# Patient Record
Sex: Female | Born: 1955 | Race: White | Hispanic: Yes | Marital: Married | Smoking: Current every day smoker
Health system: Southern US, Community
[De-identification: ages and names within clinical notes are randomized; demographics above are authoritative.]

## PROBLEM LIST (undated history)

## (undated) DIAGNOSIS — E7800 Pure hypercholesterolemia, unspecified: Secondary | ICD-10-CM

## (undated) DIAGNOSIS — F909 Attention-deficit hyperactivity disorder, unspecified type: Secondary | ICD-10-CM

---

## 2002-02-10 ENCOUNTER — Inpatient Hospital Stay (HOSPITAL_COMMUNITY): Admission: EM | Admit: 2002-02-10 | Discharge: 2002-02-14 | Payer: Self-pay | Admitting: General Surgery

## 2002-02-10 ENCOUNTER — Encounter: Payer: Self-pay | Admitting: Obstetrics and Gynecology

## 2003-10-08 ENCOUNTER — Emergency Department (HOSPITAL_COMMUNITY): Admission: EM | Admit: 2003-10-08 | Discharge: 2003-10-09 | Payer: Self-pay | Admitting: Emergency Medicine

## 2004-01-01 ENCOUNTER — Ambulatory Visit: Payer: Self-pay | Admitting: Oncology

## 2004-01-02 ENCOUNTER — Inpatient Hospital Stay (HOSPITAL_COMMUNITY): Admission: RE | Admit: 2004-01-02 | Discharge: 2004-01-03 | Payer: Self-pay | Admitting: Surgery

## 2004-01-06 ENCOUNTER — Ambulatory Visit: Payer: Self-pay | Admitting: Oncology

## 2004-02-05 ENCOUNTER — Ambulatory Visit (HOSPITAL_COMMUNITY): Admission: RE | Admit: 2004-02-05 | Discharge: 2004-02-05 | Payer: Self-pay | Admitting: Oncology

## 2004-05-05 ENCOUNTER — Ambulatory Visit: Payer: Self-pay | Admitting: Oncology

## 2005-11-03 ENCOUNTER — Ambulatory Visit (HOSPITAL_COMMUNITY): Admission: RE | Admit: 2005-11-03 | Discharge: 2005-11-03 | Payer: Self-pay | Admitting: Obstetrics and Gynecology

## 2005-11-16 ENCOUNTER — Encounter: Admission: RE | Admit: 2005-11-16 | Discharge: 2005-11-16 | Payer: Self-pay

## 2013-08-08 ENCOUNTER — Other Ambulatory Visit: Payer: Self-pay | Admitting: Gastroenterology

## 2014-12-16 ENCOUNTER — Emergency Department (HOSPITAL_COMMUNITY): Payer: BLUE CROSS/BLUE SHIELD

## 2014-12-16 ENCOUNTER — Encounter (HOSPITAL_COMMUNITY): Payer: Self-pay | Admitting: Family Medicine

## 2014-12-16 ENCOUNTER — Emergency Department (HOSPITAL_COMMUNITY)
Admission: EM | Admit: 2014-12-16 | Discharge: 2014-12-16 | Disposition: A | Discharge status: Home / Self Care | Payer: BLUE CROSS/BLUE SHIELD | Attending: Emergency Medicine | Admitting: Emergency Medicine

## 2014-12-16 DIAGNOSIS — Y9389 Activity, other specified: Secondary | ICD-10-CM | POA: Diagnosis not present

## 2014-12-16 DIAGNOSIS — S99911A Unspecified injury of right ankle, initial encounter: Secondary | ICD-10-CM | POA: Diagnosis not present

## 2014-12-16 DIAGNOSIS — S29001A Unspecified injury of muscle and tendon of front wall of thorax, initial encounter: Secondary | ICD-10-CM | POA: Insufficient documentation

## 2014-12-16 DIAGNOSIS — F17200 Nicotine dependence, unspecified, uncomplicated: Secondary | ICD-10-CM | POA: Insufficient documentation

## 2014-12-16 DIAGNOSIS — Z8639 Personal history of other endocrine, nutritional and metabolic disease: Secondary | ICD-10-CM | POA: Diagnosis not present

## 2014-12-16 DIAGNOSIS — Z8659 Personal history of other mental and behavioral disorders: Secondary | ICD-10-CM | POA: Insufficient documentation

## 2014-12-16 DIAGNOSIS — Y998 Other external cause status: Secondary | ICD-10-CM | POA: Insufficient documentation

## 2014-12-16 DIAGNOSIS — S199XXA Unspecified injury of neck, initial encounter: Secondary | ICD-10-CM | POA: Diagnosis not present

## 2014-12-16 DIAGNOSIS — Y92410 Unspecified street and highway as the place of occurrence of the external cause: Secondary | ICD-10-CM | POA: Diagnosis not present

## 2014-12-16 DIAGNOSIS — R0789 Other chest pain: Secondary | ICD-10-CM

## 2014-12-16 DIAGNOSIS — M25571 Pain in right ankle and joints of right foot: Secondary | ICD-10-CM

## 2014-12-16 HISTORY — DX: Attention-deficit hyperactivity disorder, unspecified type: F90.9

## 2014-12-16 HISTORY — DX: Pure hypercholesterolemia, unspecified: E78.00

## 2014-12-16 MED ORDER — HYDROCODONE-ACETAMINOPHEN 5-325 MG PO TABS
1.0000 | ORAL_TABLET | Freq: Once | ORAL | Status: AC
Start: 2014-12-16 — End: 2014-12-16
  Administered 2014-12-16: 1 via ORAL
  Filled 2014-12-16: qty 1

## 2014-12-16 MED ORDER — IBUPROFEN 600 MG PO TABS
600.0000 mg | ORAL_TABLET | Freq: Four times a day (QID) | ORAL | Status: AC | PRN
Start: 2014-12-16 — End: ?

## 2014-12-16 MED ORDER — HYDROCODONE-ACETAMINOPHEN 5-325 MG PO TABS: 1.0000 | ORAL_TABLET | ORAL | Status: AC | PRN
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
## 2015-03-05 ENCOUNTER — Other Ambulatory Visit (HOSPITAL_COMMUNITY)
Admission: RE | Admit: 2015-03-05 | Discharge: 2015-03-05 | Disposition: A | Discharge status: Home / Self Care | Payer: BLUE CROSS/BLUE SHIELD | Source: Ambulatory Visit | Attending: Family Medicine | Admitting: Family Medicine

## 2015-03-05 ENCOUNTER — Other Ambulatory Visit: Payer: Self-pay | Admitting: Family Medicine

## 2015-03-05 DIAGNOSIS — Z1151 Encounter for screening for human papillomavirus (HPV): Secondary | ICD-10-CM | POA: Diagnosis present

## 2015-03-05 DIAGNOSIS — Z01419 Encounter for gynecological examination (general) (routine) without abnormal findings: Secondary | ICD-10-CM | POA: Diagnosis present

## 2015-03-07 LAB — CYTOLOGY - PAP

## 2016-03-16 DIAGNOSIS — E890 Postprocedural hypothyroidism: Secondary | ICD-10-CM | POA: Diagnosis not present

## 2016-03-16 DIAGNOSIS — R799 Abnormal finding of blood chemistry, unspecified: Secondary | ICD-10-CM | POA: Diagnosis not present

## 2016-03-16 DIAGNOSIS — M25649 Stiffness of unspecified hand, not elsewhere classified: Secondary | ICD-10-CM | POA: Diagnosis not present

## 2016-03-16 DIAGNOSIS — E559 Vitamin D deficiency, unspecified: Secondary | ICD-10-CM | POA: Diagnosis not present

## 2016-03-16 DIAGNOSIS — E7800 Pure hypercholesterolemia, unspecified: Secondary | ICD-10-CM | POA: Diagnosis not present

## 2016-03-16 DIAGNOSIS — Z0000 Encounter for general adult medical examination without abnormal findings: Secondary | ICD-10-CM | POA: Diagnosis not present

## 2016-03-24 DIAGNOSIS — F900 Attention-deficit hyperactivity disorder, predominantly inattentive type: Secondary | ICD-10-CM | POA: Diagnosis not present

## 2016-03-24 DIAGNOSIS — F419 Anxiety disorder, unspecified: Secondary | ICD-10-CM | POA: Diagnosis not present

## 2016-03-31 DIAGNOSIS — R748 Abnormal levels of other serum enzymes: Secondary | ICD-10-CM | POA: Diagnosis not present

## 2017-03-24 DIAGNOSIS — E7800 Pure hypercholesterolemia, unspecified: Secondary | ICD-10-CM | POA: Diagnosis not present

## 2017-03-24 DIAGNOSIS — E559 Vitamin D deficiency, unspecified: Secondary | ICD-10-CM | POA: Diagnosis not present

## 2017-03-24 DIAGNOSIS — K219 Gastro-esophageal reflux disease without esophagitis: Secondary | ICD-10-CM | POA: Diagnosis not present

## 2017-03-24 DIAGNOSIS — E890 Postprocedural hypothyroidism: Secondary | ICD-10-CM | POA: Diagnosis not present

## 2017-03-24 DIAGNOSIS — Z0000 Encounter for general adult medical examination without abnormal findings: Secondary | ICD-10-CM | POA: Diagnosis not present

## 2017-03-31 ENCOUNTER — Other Ambulatory Visit: Payer: Self-pay | Admitting: Family Medicine

## 2017-03-31 DIAGNOSIS — Z780 Asymptomatic menopausal state: Secondary | ICD-10-CM

## 2017-05-11 ENCOUNTER — Other Ambulatory Visit: Payer: Self-pay | Admitting: Gastroenterology

## 2017-05-11 DIAGNOSIS — R748 Abnormal levels of other serum enzymes: Secondary | ICD-10-CM | POA: Diagnosis not present

## 2017-05-31 DIAGNOSIS — F419 Anxiety disorder, unspecified: Secondary | ICD-10-CM | POA: Diagnosis not present

## 2017-05-31 DIAGNOSIS — F900 Attention-deficit hyperactivity disorder, predominantly inattentive type: Secondary | ICD-10-CM | POA: Diagnosis not present

## 2017-06-20 ENCOUNTER — Other Ambulatory Visit: Payer: BLUE CROSS/BLUE SHIELD

## 2017-07-25 DIAGNOSIS — Z23 Encounter for immunization: Secondary | ICD-10-CM | POA: Diagnosis not present

## 2017-07-27 ENCOUNTER — Ambulatory Visit
Admission: RE | Admit: 2017-07-27 | Discharge: 2017-07-27 | Disposition: A | Discharge status: Home / Self Care | Payer: BLUE CROSS/BLUE SHIELD | Source: Ambulatory Visit | Attending: Gastroenterology | Admitting: Gastroenterology

## 2017-07-27 DIAGNOSIS — R748 Abnormal levels of other serum enzymes: Secondary | ICD-10-CM

## 2017-09-27 DIAGNOSIS — R748 Abnormal levels of other serum enzymes: Secondary | ICD-10-CM | POA: Diagnosis not present

## 2017-12-01 DIAGNOSIS — F419 Anxiety disorder, unspecified: Secondary | ICD-10-CM | POA: Diagnosis not present

## 2017-12-01 DIAGNOSIS — F902 Attention-deficit hyperactivity disorder, combined type: Secondary | ICD-10-CM | POA: Diagnosis not present

## 2018-04-04 DIAGNOSIS — E7800 Pure hypercholesterolemia, unspecified: Secondary | ICD-10-CM | POA: Diagnosis not present

## 2018-04-04 DIAGNOSIS — Z0000 Encounter for general adult medical examination without abnormal findings: Secondary | ICD-10-CM | POA: Diagnosis not present

## 2018-04-04 DIAGNOSIS — K219 Gastro-esophageal reflux disease without esophagitis: Secondary | ICD-10-CM | POA: Diagnosis not present

## 2018-04-04 DIAGNOSIS — E559 Vitamin D deficiency, unspecified: Secondary | ICD-10-CM | POA: Diagnosis not present

## 2018-04-04 DIAGNOSIS — E890 Postprocedural hypothyroidism: Secondary | ICD-10-CM | POA: Diagnosis not present

## 2018-07-19 DIAGNOSIS — F419 Anxiety disorder, unspecified: Secondary | ICD-10-CM | POA: Diagnosis not present

## 2018-07-19 DIAGNOSIS — F902 Attention-deficit hyperactivity disorder, combined type: Secondary | ICD-10-CM | POA: Diagnosis not present

## 2018-11-02 DIAGNOSIS — R748 Abnormal levels of other serum enzymes: Secondary | ICD-10-CM | POA: Diagnosis not present

## 2019-02-14 DIAGNOSIS — F419 Anxiety disorder, unspecified: Secondary | ICD-10-CM | POA: Diagnosis not present

## 2019-02-14 DIAGNOSIS — F902 Attention-deficit hyperactivity disorder, combined type: Secondary | ICD-10-CM | POA: Diagnosis not present

## 2019-04-18 DIAGNOSIS — Z0000 Encounter for general adult medical examination without abnormal findings: Secondary | ICD-10-CM | POA: Diagnosis not present

## 2019-04-18 DIAGNOSIS — E7800 Pure hypercholesterolemia, unspecified: Secondary | ICD-10-CM | POA: Diagnosis not present

## 2019-04-18 DIAGNOSIS — E890 Postprocedural hypothyroidism: Secondary | ICD-10-CM | POA: Diagnosis not present

## 2019-04-18 DIAGNOSIS — R748 Abnormal levels of other serum enzymes: Secondary | ICD-10-CM | POA: Diagnosis not present

## 2019-04-18 DIAGNOSIS — Z131 Encounter for screening for diabetes mellitus: Secondary | ICD-10-CM | POA: Diagnosis not present

## 2019-04-20 ENCOUNTER — Other Ambulatory Visit: Payer: Self-pay | Admitting: Family Medicine

## 2019-04-20 DIAGNOSIS — Z1231 Encounter for screening mammogram for malignant neoplasm of breast: Secondary | ICD-10-CM

## 2019-05-23 DIAGNOSIS — Z03818 Encounter for observation for suspected exposure to other biological agents ruled out: Secondary | ICD-10-CM | POA: Diagnosis not present

## 2019-06-03 DIAGNOSIS — H109 Unspecified conjunctivitis: Secondary | ICD-10-CM | POA: Diagnosis not present

## 2019-06-12 DIAGNOSIS — H1033 Unspecified acute conjunctivitis, bilateral: Secondary | ICD-10-CM | POA: Diagnosis not present

## 2019-06-12 DIAGNOSIS — H18893 Other specified disorders of cornea, bilateral: Secondary | ICD-10-CM | POA: Diagnosis not present

## 2019-06-20 ENCOUNTER — Other Ambulatory Visit: Payer: Self-pay

## 2019-06-20 ENCOUNTER — Ambulatory Visit
Admission: RE | Admit: 2019-06-20 | Discharge: 2019-06-20 | Disposition: A | Discharge status: Home / Self Care | Payer: BC Managed Care – PPO | Source: Ambulatory Visit | Attending: Family Medicine | Admitting: Family Medicine

## 2019-06-20 DIAGNOSIS — Z1231 Encounter for screening mammogram for malignant neoplasm of breast: Secondary | ICD-10-CM

## 2019-08-15 DIAGNOSIS — F411 Generalized anxiety disorder: Secondary | ICD-10-CM | POA: Diagnosis not present

## 2019-08-15 DIAGNOSIS — F902 Attention-deficit hyperactivity disorder, combined type: Secondary | ICD-10-CM | POA: Diagnosis not present

## 2019-09-11 DIAGNOSIS — Z20822 Contact with and (suspected) exposure to covid-19: Secondary | ICD-10-CM | POA: Diagnosis not present

## 2019-11-06 DIAGNOSIS — Z20822 Contact with and (suspected) exposure to covid-19: Secondary | ICD-10-CM | POA: Diagnosis not present

## 2019-11-14 DIAGNOSIS — F902 Attention-deficit hyperactivity disorder, combined type: Secondary | ICD-10-CM | POA: Diagnosis not present

## 2019-11-14 DIAGNOSIS — F411 Generalized anxiety disorder: Secondary | ICD-10-CM | POA: Diagnosis not present

## 2019-12-03 DIAGNOSIS — Z20822 Contact with and (suspected) exposure to covid-19: Secondary | ICD-10-CM | POA: Diagnosis not present

## 2019-12-13 DIAGNOSIS — Z20822 Contact with and (suspected) exposure to covid-19: Secondary | ICD-10-CM | POA: Diagnosis not present

## 2020-02-13 DIAGNOSIS — F900 Attention-deficit hyperactivity disorder, predominantly inattentive type: Secondary | ICD-10-CM | POA: Diagnosis not present

## 2020-02-13 DIAGNOSIS — F411 Generalized anxiety disorder: Secondary | ICD-10-CM | POA: Diagnosis not present

## 2020-02-19 DIAGNOSIS — Z20822 Contact with and (suspected) exposure to covid-19: Secondary | ICD-10-CM | POA: Diagnosis not present

## 2020-06-11 DIAGNOSIS — F411 Generalized anxiety disorder: Secondary | ICD-10-CM | POA: Diagnosis not present

## 2020-06-11 DIAGNOSIS — F900 Attention-deficit hyperactivity disorder, predominantly inattentive type: Secondary | ICD-10-CM | POA: Diagnosis not present

## 2020-06-17 DIAGNOSIS — M9905 Segmental and somatic dysfunction of pelvic region: Secondary | ICD-10-CM | POA: Diagnosis not present

## 2020-06-17 DIAGNOSIS — M9903 Segmental and somatic dysfunction of lumbar region: Secondary | ICD-10-CM | POA: Diagnosis not present

## 2020-06-17 DIAGNOSIS — M9901 Segmental and somatic dysfunction of cervical region: Secondary | ICD-10-CM | POA: Diagnosis not present

## 2020-06-17 DIAGNOSIS — M9904 Segmental and somatic dysfunction of sacral region: Secondary | ICD-10-CM | POA: Diagnosis not present

## 2020-06-19 DIAGNOSIS — M9904 Segmental and somatic dysfunction of sacral region: Secondary | ICD-10-CM | POA: Diagnosis not present

## 2020-06-19 DIAGNOSIS — M9901 Segmental and somatic dysfunction of cervical region: Secondary | ICD-10-CM | POA: Diagnosis not present

## 2020-06-19 DIAGNOSIS — M9905 Segmental and somatic dysfunction of pelvic region: Secondary | ICD-10-CM | POA: Diagnosis not present

## 2020-06-19 DIAGNOSIS — M9903 Segmental and somatic dysfunction of lumbar region: Secondary | ICD-10-CM | POA: Diagnosis not present

## 2020-06-20 DIAGNOSIS — M9903 Segmental and somatic dysfunction of lumbar region: Secondary | ICD-10-CM | POA: Diagnosis not present

## 2020-06-20 DIAGNOSIS — M9905 Segmental and somatic dysfunction of pelvic region: Secondary | ICD-10-CM | POA: Diagnosis not present

## 2020-06-20 DIAGNOSIS — M9904 Segmental and somatic dysfunction of sacral region: Secondary | ICD-10-CM | POA: Diagnosis not present

## 2020-06-20 DIAGNOSIS — M9901 Segmental and somatic dysfunction of cervical region: Secondary | ICD-10-CM | POA: Diagnosis not present

## 2020-06-23 DIAGNOSIS — M9905 Segmental and somatic dysfunction of pelvic region: Secondary | ICD-10-CM | POA: Diagnosis not present

## 2020-06-23 DIAGNOSIS — M9904 Segmental and somatic dysfunction of sacral region: Secondary | ICD-10-CM | POA: Diagnosis not present

## 2020-06-23 DIAGNOSIS — M9901 Segmental and somatic dysfunction of cervical region: Secondary | ICD-10-CM | POA: Diagnosis not present

## 2020-06-23 DIAGNOSIS — M9903 Segmental and somatic dysfunction of lumbar region: Secondary | ICD-10-CM | POA: Diagnosis not present

## 2020-06-24 DIAGNOSIS — M9904 Segmental and somatic dysfunction of sacral region: Secondary | ICD-10-CM | POA: Diagnosis not present

## 2020-06-24 DIAGNOSIS — M9901 Segmental and somatic dysfunction of cervical region: Secondary | ICD-10-CM | POA: Diagnosis not present

## 2020-06-24 DIAGNOSIS — M9905 Segmental and somatic dysfunction of pelvic region: Secondary | ICD-10-CM | POA: Diagnosis not present

## 2020-06-24 DIAGNOSIS — M9903 Segmental and somatic dysfunction of lumbar region: Secondary | ICD-10-CM | POA: Diagnosis not present

## 2020-06-26 DIAGNOSIS — M9903 Segmental and somatic dysfunction of lumbar region: Secondary | ICD-10-CM | POA: Diagnosis not present

## 2020-06-26 DIAGNOSIS — M9905 Segmental and somatic dysfunction of pelvic region: Secondary | ICD-10-CM | POA: Diagnosis not present

## 2020-06-26 DIAGNOSIS — M9901 Segmental and somatic dysfunction of cervical region: Secondary | ICD-10-CM | POA: Diagnosis not present

## 2020-06-26 DIAGNOSIS — M9904 Segmental and somatic dysfunction of sacral region: Secondary | ICD-10-CM | POA: Diagnosis not present

## 2020-07-01 DIAGNOSIS — M9903 Segmental and somatic dysfunction of lumbar region: Secondary | ICD-10-CM | POA: Diagnosis not present

## 2020-07-01 DIAGNOSIS — M9901 Segmental and somatic dysfunction of cervical region: Secondary | ICD-10-CM | POA: Diagnosis not present

## 2020-07-01 DIAGNOSIS — M9904 Segmental and somatic dysfunction of sacral region: Secondary | ICD-10-CM | POA: Diagnosis not present

## 2020-07-01 DIAGNOSIS — M9905 Segmental and somatic dysfunction of pelvic region: Secondary | ICD-10-CM | POA: Diagnosis not present

## 2020-07-11 DIAGNOSIS — M9905 Segmental and somatic dysfunction of pelvic region: Secondary | ICD-10-CM | POA: Diagnosis not present

## 2020-07-11 DIAGNOSIS — M9901 Segmental and somatic dysfunction of cervical region: Secondary | ICD-10-CM | POA: Diagnosis not present

## 2020-07-11 DIAGNOSIS — M9903 Segmental and somatic dysfunction of lumbar region: Secondary | ICD-10-CM | POA: Diagnosis not present

## 2020-07-11 DIAGNOSIS — M9904 Segmental and somatic dysfunction of sacral region: Secondary | ICD-10-CM | POA: Diagnosis not present

## 2020-07-14 DIAGNOSIS — M9903 Segmental and somatic dysfunction of lumbar region: Secondary | ICD-10-CM | POA: Diagnosis not present

## 2020-07-14 DIAGNOSIS — M9905 Segmental and somatic dysfunction of pelvic region: Secondary | ICD-10-CM | POA: Diagnosis not present

## 2020-07-14 DIAGNOSIS — M9901 Segmental and somatic dysfunction of cervical region: Secondary | ICD-10-CM | POA: Diagnosis not present

## 2020-07-14 DIAGNOSIS — M9904 Segmental and somatic dysfunction of sacral region: Secondary | ICD-10-CM | POA: Diagnosis not present

## 2020-07-15 DIAGNOSIS — M9903 Segmental and somatic dysfunction of lumbar region: Secondary | ICD-10-CM | POA: Diagnosis not present

## 2020-07-15 DIAGNOSIS — M9905 Segmental and somatic dysfunction of pelvic region: Secondary | ICD-10-CM | POA: Diagnosis not present

## 2020-07-15 DIAGNOSIS — M9904 Segmental and somatic dysfunction of sacral region: Secondary | ICD-10-CM | POA: Diagnosis not present

## 2020-07-15 DIAGNOSIS — M9901 Segmental and somatic dysfunction of cervical region: Secondary | ICD-10-CM | POA: Diagnosis not present

## 2020-07-17 DIAGNOSIS — M9903 Segmental and somatic dysfunction of lumbar region: Secondary | ICD-10-CM | POA: Diagnosis not present

## 2020-07-17 DIAGNOSIS — M9904 Segmental and somatic dysfunction of sacral region: Secondary | ICD-10-CM | POA: Diagnosis not present

## 2020-07-17 DIAGNOSIS — M9905 Segmental and somatic dysfunction of pelvic region: Secondary | ICD-10-CM | POA: Diagnosis not present

## 2020-07-17 DIAGNOSIS — M9901 Segmental and somatic dysfunction of cervical region: Secondary | ICD-10-CM | POA: Diagnosis not present

## 2020-07-21 DIAGNOSIS — M9903 Segmental and somatic dysfunction of lumbar region: Secondary | ICD-10-CM | POA: Diagnosis not present

## 2020-07-21 DIAGNOSIS — M9901 Segmental and somatic dysfunction of cervical region: Secondary | ICD-10-CM | POA: Diagnosis not present

## 2020-07-21 DIAGNOSIS — M9905 Segmental and somatic dysfunction of pelvic region: Secondary | ICD-10-CM | POA: Diagnosis not present

## 2020-07-21 DIAGNOSIS — M9904 Segmental and somatic dysfunction of sacral region: Secondary | ICD-10-CM | POA: Diagnosis not present

## 2020-07-22 DIAGNOSIS — M9904 Segmental and somatic dysfunction of sacral region: Secondary | ICD-10-CM | POA: Diagnosis not present

## 2020-07-22 DIAGNOSIS — M9905 Segmental and somatic dysfunction of pelvic region: Secondary | ICD-10-CM | POA: Diagnosis not present

## 2020-07-22 DIAGNOSIS — M9903 Segmental and somatic dysfunction of lumbar region: Secondary | ICD-10-CM | POA: Diagnosis not present

## 2020-07-22 DIAGNOSIS — M9901 Segmental and somatic dysfunction of cervical region: Secondary | ICD-10-CM | POA: Diagnosis not present

## 2020-07-23 DIAGNOSIS — L942 Calcinosis cutis: Secondary | ICD-10-CM | POA: Diagnosis not present

## 2020-07-23 DIAGNOSIS — D485 Neoplasm of uncertain behavior of skin: Secondary | ICD-10-CM | POA: Diagnosis not present

## 2020-07-23 DIAGNOSIS — L570 Actinic keratosis: Secondary | ICD-10-CM | POA: Diagnosis not present

## 2020-07-24 DIAGNOSIS — M9904 Segmental and somatic dysfunction of sacral region: Secondary | ICD-10-CM | POA: Diagnosis not present

## 2020-07-24 DIAGNOSIS — M9903 Segmental and somatic dysfunction of lumbar region: Secondary | ICD-10-CM | POA: Diagnosis not present

## 2020-07-24 DIAGNOSIS — M9901 Segmental and somatic dysfunction of cervical region: Secondary | ICD-10-CM | POA: Diagnosis not present

## 2020-07-24 DIAGNOSIS — M9905 Segmental and somatic dysfunction of pelvic region: Secondary | ICD-10-CM | POA: Diagnosis not present

## 2020-07-28 DIAGNOSIS — M9905 Segmental and somatic dysfunction of pelvic region: Secondary | ICD-10-CM | POA: Diagnosis not present

## 2020-07-28 DIAGNOSIS — M9903 Segmental and somatic dysfunction of lumbar region: Secondary | ICD-10-CM | POA: Diagnosis not present

## 2020-07-28 DIAGNOSIS — M9904 Segmental and somatic dysfunction of sacral region: Secondary | ICD-10-CM | POA: Diagnosis not present

## 2020-07-28 DIAGNOSIS — M9901 Segmental and somatic dysfunction of cervical region: Secondary | ICD-10-CM | POA: Diagnosis not present

## 2020-08-01 DIAGNOSIS — M9905 Segmental and somatic dysfunction of pelvic region: Secondary | ICD-10-CM | POA: Diagnosis not present

## 2020-08-01 DIAGNOSIS — M9903 Segmental and somatic dysfunction of lumbar region: Secondary | ICD-10-CM | POA: Diagnosis not present

## 2020-08-01 DIAGNOSIS — M9904 Segmental and somatic dysfunction of sacral region: Secondary | ICD-10-CM | POA: Diagnosis not present

## 2020-08-01 DIAGNOSIS — M9901 Segmental and somatic dysfunction of cervical region: Secondary | ICD-10-CM | POA: Diagnosis not present

## 2020-08-05 ENCOUNTER — Other Ambulatory Visit: Payer: Self-pay | Admitting: Family Medicine

## 2020-08-05 DIAGNOSIS — M9903 Segmental and somatic dysfunction of lumbar region: Secondary | ICD-10-CM | POA: Diagnosis not present

## 2020-08-05 DIAGNOSIS — M9904 Segmental and somatic dysfunction of sacral region: Secondary | ICD-10-CM | POA: Diagnosis not present

## 2020-08-05 DIAGNOSIS — M9901 Segmental and somatic dysfunction of cervical region: Secondary | ICD-10-CM | POA: Diagnosis not present

## 2020-08-05 DIAGNOSIS — Z1231 Encounter for screening mammogram for malignant neoplasm of breast: Secondary | ICD-10-CM

## 2020-08-05 DIAGNOSIS — M9905 Segmental and somatic dysfunction of pelvic region: Secondary | ICD-10-CM | POA: Diagnosis not present

## 2020-08-07 ENCOUNTER — Ambulatory Visit
Admission: RE | Admit: 2020-08-07 | Discharge: 2020-08-07 | Disposition: A | Discharge status: Home / Self Care | Payer: BC Managed Care – PPO | Source: Ambulatory Visit

## 2020-08-07 ENCOUNTER — Other Ambulatory Visit: Payer: Self-pay

## 2020-08-07 DIAGNOSIS — E559 Vitamin D deficiency, unspecified: Secondary | ICD-10-CM | POA: Diagnosis not present

## 2020-08-07 DIAGNOSIS — Z124 Encounter for screening for malignant neoplasm of cervix: Secondary | ICD-10-CM | POA: Diagnosis not present

## 2020-08-07 DIAGNOSIS — R748 Abnormal levels of other serum enzymes: Secondary | ICD-10-CM | POA: Diagnosis not present

## 2020-08-07 DIAGNOSIS — M9901 Segmental and somatic dysfunction of cervical region: Secondary | ICD-10-CM | POA: Diagnosis not present

## 2020-08-07 DIAGNOSIS — M9904 Segmental and somatic dysfunction of sacral region: Secondary | ICD-10-CM | POA: Diagnosis not present

## 2020-08-07 DIAGNOSIS — K219 Gastro-esophageal reflux disease without esophagitis: Secondary | ICD-10-CM | POA: Diagnosis not present

## 2020-08-07 DIAGNOSIS — Z1231 Encounter for screening mammogram for malignant neoplasm of breast: Secondary | ICD-10-CM | POA: Diagnosis not present

## 2020-08-07 DIAGNOSIS — M9903 Segmental and somatic dysfunction of lumbar region: Secondary | ICD-10-CM | POA: Diagnosis not present

## 2020-08-07 DIAGNOSIS — Z0000 Encounter for general adult medical examination without abnormal findings: Secondary | ICD-10-CM | POA: Diagnosis not present

## 2020-08-07 DIAGNOSIS — E7800 Pure hypercholesterolemia, unspecified: Secondary | ICD-10-CM | POA: Diagnosis not present

## 2020-08-07 DIAGNOSIS — M9905 Segmental and somatic dysfunction of pelvic region: Secondary | ICD-10-CM | POA: Diagnosis not present

## 2020-08-12 DIAGNOSIS — M9905 Segmental and somatic dysfunction of pelvic region: Secondary | ICD-10-CM | POA: Diagnosis not present

## 2020-08-12 DIAGNOSIS — M9904 Segmental and somatic dysfunction of sacral region: Secondary | ICD-10-CM | POA: Diagnosis not present

## 2020-08-12 DIAGNOSIS — M9903 Segmental and somatic dysfunction of lumbar region: Secondary | ICD-10-CM | POA: Diagnosis not present

## 2020-08-12 DIAGNOSIS — M9901 Segmental and somatic dysfunction of cervical region: Secondary | ICD-10-CM | POA: Diagnosis not present

## 2020-08-14 DIAGNOSIS — M9904 Segmental and somatic dysfunction of sacral region: Secondary | ICD-10-CM | POA: Diagnosis not present

## 2020-08-14 DIAGNOSIS — M9905 Segmental and somatic dysfunction of pelvic region: Secondary | ICD-10-CM | POA: Diagnosis not present

## 2020-08-14 DIAGNOSIS — M9901 Segmental and somatic dysfunction of cervical region: Secondary | ICD-10-CM | POA: Diagnosis not present

## 2020-08-14 DIAGNOSIS — M9903 Segmental and somatic dysfunction of lumbar region: Secondary | ICD-10-CM | POA: Diagnosis not present

## 2020-08-19 DIAGNOSIS — M9903 Segmental and somatic dysfunction of lumbar region: Secondary | ICD-10-CM | POA: Diagnosis not present

## 2020-08-19 DIAGNOSIS — M9904 Segmental and somatic dysfunction of sacral region: Secondary | ICD-10-CM | POA: Diagnosis not present

## 2020-08-19 DIAGNOSIS — M9901 Segmental and somatic dysfunction of cervical region: Secondary | ICD-10-CM | POA: Diagnosis not present

## 2020-08-19 DIAGNOSIS — M9905 Segmental and somatic dysfunction of pelvic region: Secondary | ICD-10-CM | POA: Diagnosis not present

## 2020-08-21 DIAGNOSIS — M9904 Segmental and somatic dysfunction of sacral region: Secondary | ICD-10-CM | POA: Diagnosis not present

## 2020-08-21 DIAGNOSIS — M9905 Segmental and somatic dysfunction of pelvic region: Secondary | ICD-10-CM | POA: Diagnosis not present

## 2020-08-21 DIAGNOSIS — M9901 Segmental and somatic dysfunction of cervical region: Secondary | ICD-10-CM | POA: Diagnosis not present

## 2020-08-21 DIAGNOSIS — M9903 Segmental and somatic dysfunction of lumbar region: Secondary | ICD-10-CM | POA: Diagnosis not present

## 2020-08-26 DIAGNOSIS — M9905 Segmental and somatic dysfunction of pelvic region: Secondary | ICD-10-CM | POA: Diagnosis not present

## 2020-08-26 DIAGNOSIS — M9901 Segmental and somatic dysfunction of cervical region: Secondary | ICD-10-CM | POA: Diagnosis not present

## 2020-08-26 DIAGNOSIS — M9904 Segmental and somatic dysfunction of sacral region: Secondary | ICD-10-CM | POA: Diagnosis not present

## 2020-08-26 DIAGNOSIS — M9903 Segmental and somatic dysfunction of lumbar region: Secondary | ICD-10-CM | POA: Diagnosis not present

## 2020-08-27 DIAGNOSIS — M9904 Segmental and somatic dysfunction of sacral region: Secondary | ICD-10-CM | POA: Diagnosis not present

## 2020-08-27 DIAGNOSIS — M9905 Segmental and somatic dysfunction of pelvic region: Secondary | ICD-10-CM | POA: Diagnosis not present

## 2020-08-27 DIAGNOSIS — M9901 Segmental and somatic dysfunction of cervical region: Secondary | ICD-10-CM | POA: Diagnosis not present

## 2020-08-27 DIAGNOSIS — M9903 Segmental and somatic dysfunction of lumbar region: Secondary | ICD-10-CM | POA: Diagnosis not present

## 2020-08-28 DIAGNOSIS — M9904 Segmental and somatic dysfunction of sacral region: Secondary | ICD-10-CM | POA: Diagnosis not present

## 2020-08-28 DIAGNOSIS — M9901 Segmental and somatic dysfunction of cervical region: Secondary | ICD-10-CM | POA: Diagnosis not present

## 2020-08-28 DIAGNOSIS — M9903 Segmental and somatic dysfunction of lumbar region: Secondary | ICD-10-CM | POA: Diagnosis not present

## 2020-08-28 DIAGNOSIS — M9905 Segmental and somatic dysfunction of pelvic region: Secondary | ICD-10-CM | POA: Diagnosis not present

## 2020-09-01 DIAGNOSIS — M9901 Segmental and somatic dysfunction of cervical region: Secondary | ICD-10-CM | POA: Diagnosis not present

## 2020-09-01 DIAGNOSIS — M9904 Segmental and somatic dysfunction of sacral region: Secondary | ICD-10-CM | POA: Diagnosis not present

## 2020-09-01 DIAGNOSIS — M9905 Segmental and somatic dysfunction of pelvic region: Secondary | ICD-10-CM | POA: Diagnosis not present

## 2020-09-01 DIAGNOSIS — M9903 Segmental and somatic dysfunction of lumbar region: Secondary | ICD-10-CM | POA: Diagnosis not present

## 2020-09-02 DIAGNOSIS — M9901 Segmental and somatic dysfunction of cervical region: Secondary | ICD-10-CM | POA: Diagnosis not present

## 2020-09-02 DIAGNOSIS — M9905 Segmental and somatic dysfunction of pelvic region: Secondary | ICD-10-CM | POA: Diagnosis not present

## 2020-09-02 DIAGNOSIS — M9903 Segmental and somatic dysfunction of lumbar region: Secondary | ICD-10-CM | POA: Diagnosis not present

## 2020-09-02 DIAGNOSIS — M9904 Segmental and somatic dysfunction of sacral region: Secondary | ICD-10-CM | POA: Diagnosis not present

## 2020-09-10 DIAGNOSIS — F900 Attention-deficit hyperactivity disorder, predominantly inattentive type: Secondary | ICD-10-CM | POA: Diagnosis not present

## 2020-09-10 DIAGNOSIS — F411 Generalized anxiety disorder: Secondary | ICD-10-CM | POA: Diagnosis not present

## 2020-09-25 DIAGNOSIS — H2513 Age-related nuclear cataract, bilateral: Secondary | ICD-10-CM | POA: Diagnosis not present

## 2020-09-25 DIAGNOSIS — H35722 Serous detachment of retinal pigment epithelium, left eye: Secondary | ICD-10-CM | POA: Diagnosis not present

## 2021-01-07 DIAGNOSIS — F411 Generalized anxiety disorder: Secondary | ICD-10-CM | POA: Diagnosis not present

## 2021-01-07 DIAGNOSIS — F900 Attention-deficit hyperactivity disorder, predominantly inattentive type: Secondary | ICD-10-CM | POA: Diagnosis not present

## 2021-04-07 DIAGNOSIS — Z0000 Encounter for general adult medical examination without abnormal findings: Secondary | ICD-10-CM | POA: Diagnosis not present

## 2021-04-08 DIAGNOSIS — F900 Attention-deficit hyperactivity disorder, predominantly inattentive type: Secondary | ICD-10-CM | POA: Diagnosis not present

## 2021-04-08 DIAGNOSIS — F411 Generalized anxiety disorder: Secondary | ICD-10-CM | POA: Diagnosis not present

## 2021-04-21 DIAGNOSIS — D229 Melanocytic nevi, unspecified: Secondary | ICD-10-CM | POA: Diagnosis not present

## 2021-04-28 DIAGNOSIS — B079 Viral wart, unspecified: Secondary | ICD-10-CM | POA: Diagnosis not present

## 2021-09-10 DIAGNOSIS — U071 COVID-19: Secondary | ICD-10-CM | POA: Diagnosis not present

## 2021-09-10 DIAGNOSIS — R0981 Nasal congestion: Secondary | ICD-10-CM | POA: Diagnosis not present

## 2021-09-10 DIAGNOSIS — R519 Headache, unspecified: Secondary | ICD-10-CM | POA: Diagnosis not present

## 2021-09-29 DIAGNOSIS — E559 Vitamin D deficiency, unspecified: Secondary | ICD-10-CM | POA: Diagnosis not present

## 2021-09-29 DIAGNOSIS — E7800 Pure hypercholesterolemia, unspecified: Secondary | ICD-10-CM | POA: Diagnosis not present

## 2021-09-29 DIAGNOSIS — H6123 Impacted cerumen, bilateral: Secondary | ICD-10-CM | POA: Diagnosis not present

## 2021-09-29 DIAGNOSIS — Z6825 Body mass index (BMI) 25.0-25.9, adult: Secondary | ICD-10-CM | POA: Diagnosis not present

## 2021-09-29 DIAGNOSIS — Z0000 Encounter for general adult medical examination without abnormal findings: Secondary | ICD-10-CM | POA: Diagnosis not present

## 2021-11-11 IMAGING — MG DIGITAL SCREENING BILAT W/ TOMO W/ CAD
8 series · 8 of 24 positions shown · non-contrast
Comparison: Previous exam(s).

CLINICAL DATA: Screening.

EXAM:
DIGITAL SCREENING BILATERAL MAMMOGRAM WITH TOMO AND CAD

[R MLO synth-2D]
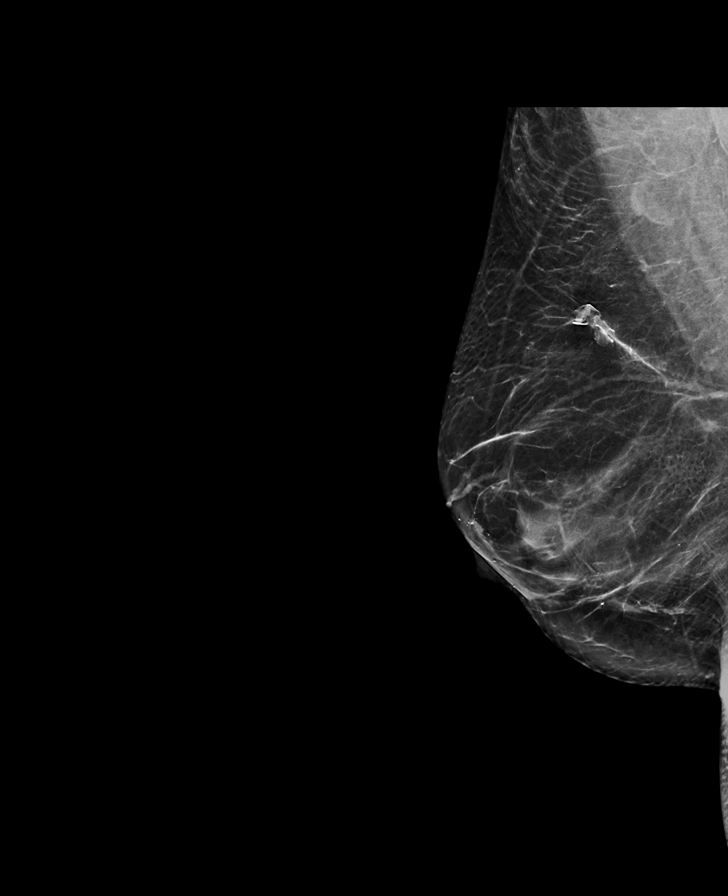

[R CC synth-2D]
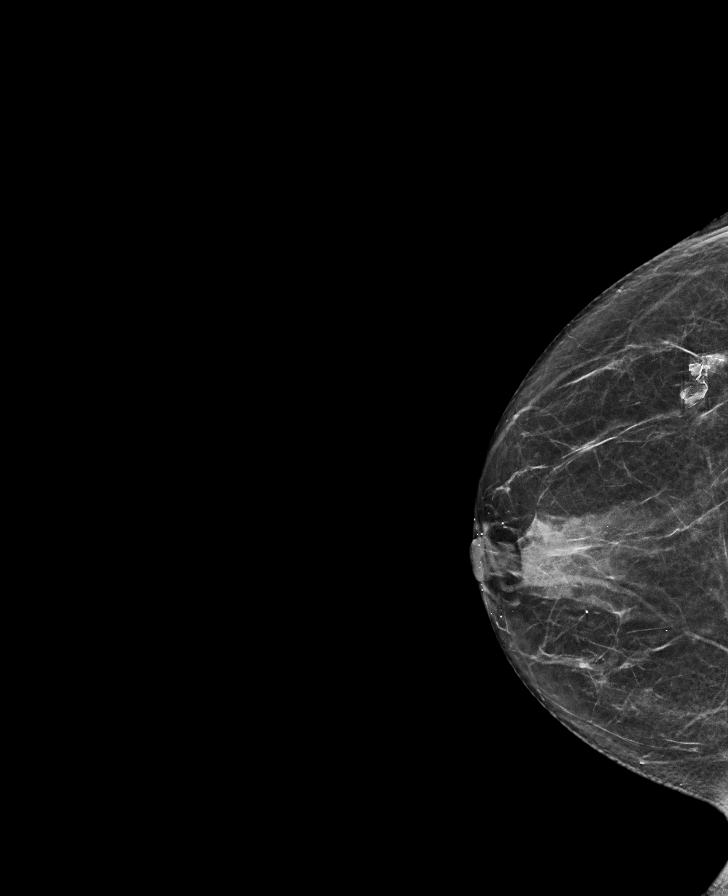

[L MLO synth-2D]
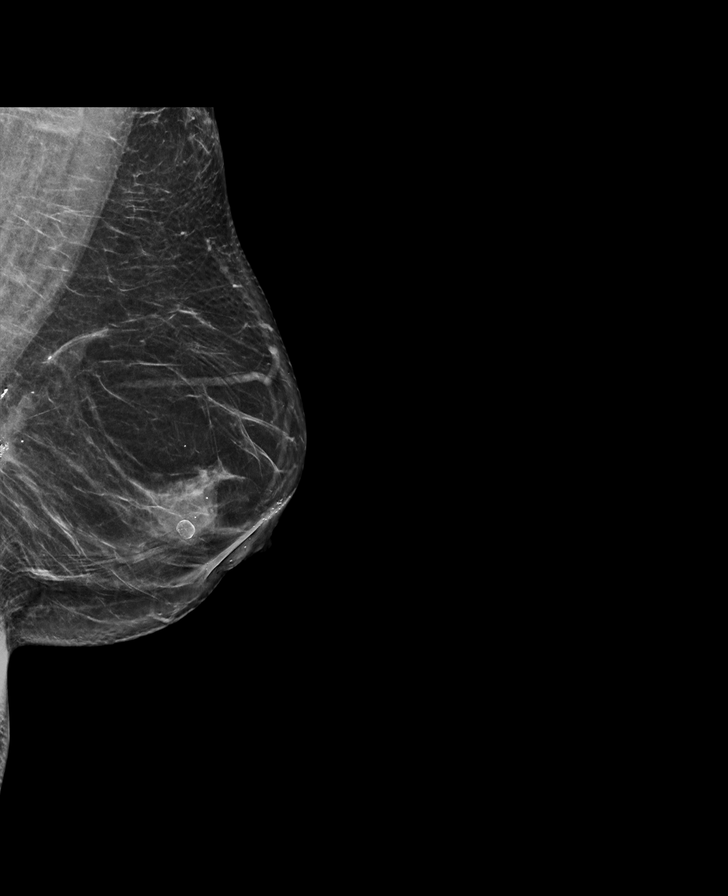

[L CC synth-2D]
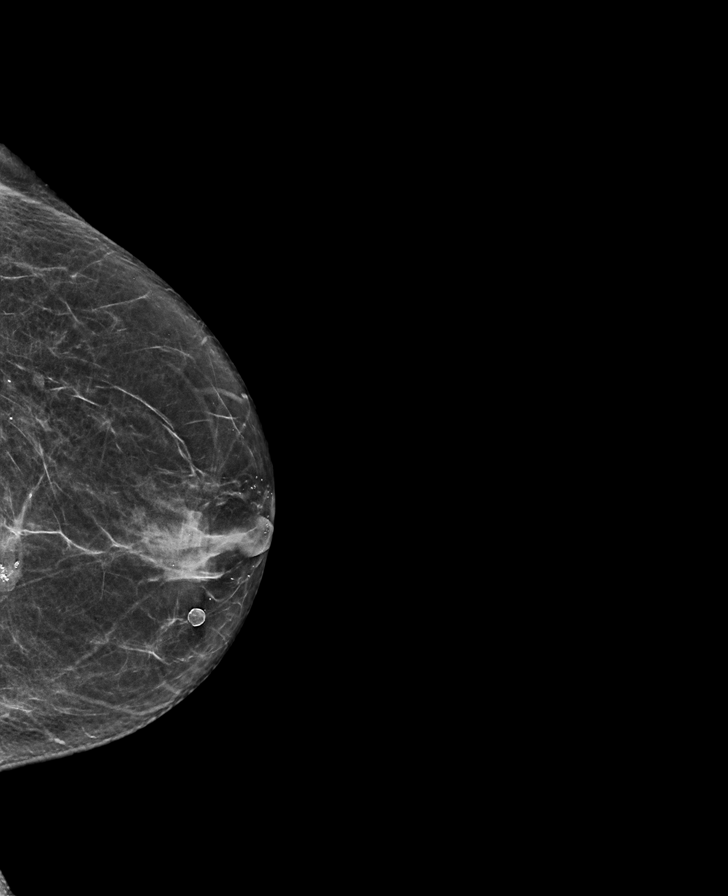

[L MLO tomo · tomo slice 36/71.0]
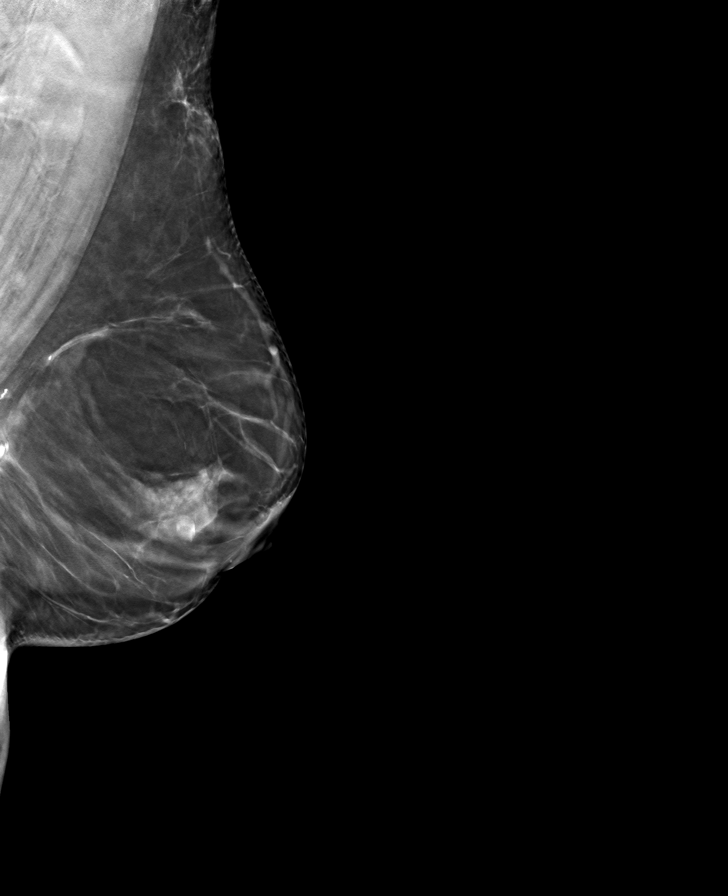

[R MLO tomo · tomo slice 37/74.0]
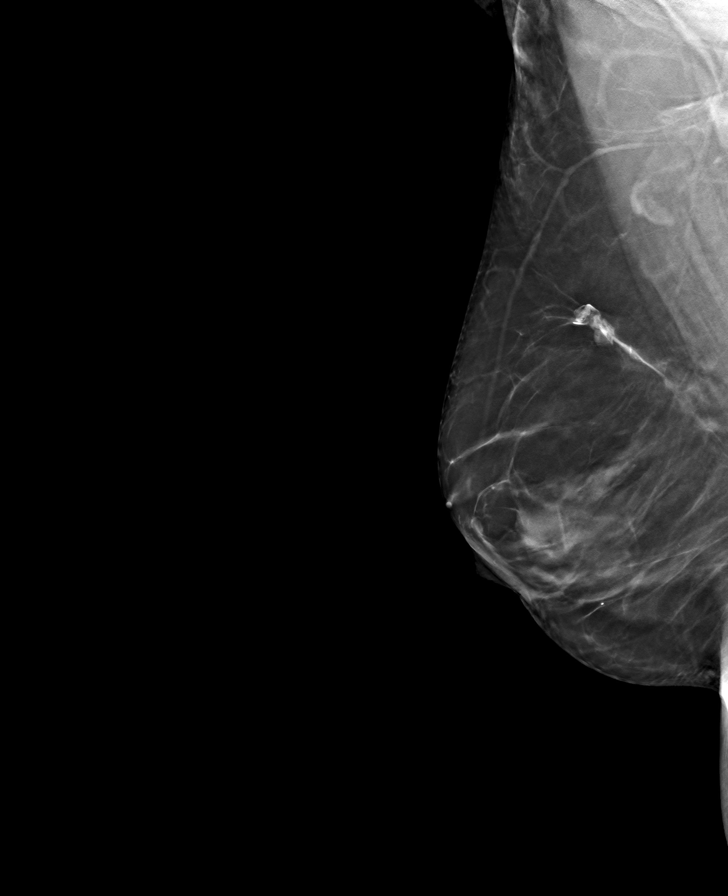

[R CC tomo · tomo slice 31/60.0]
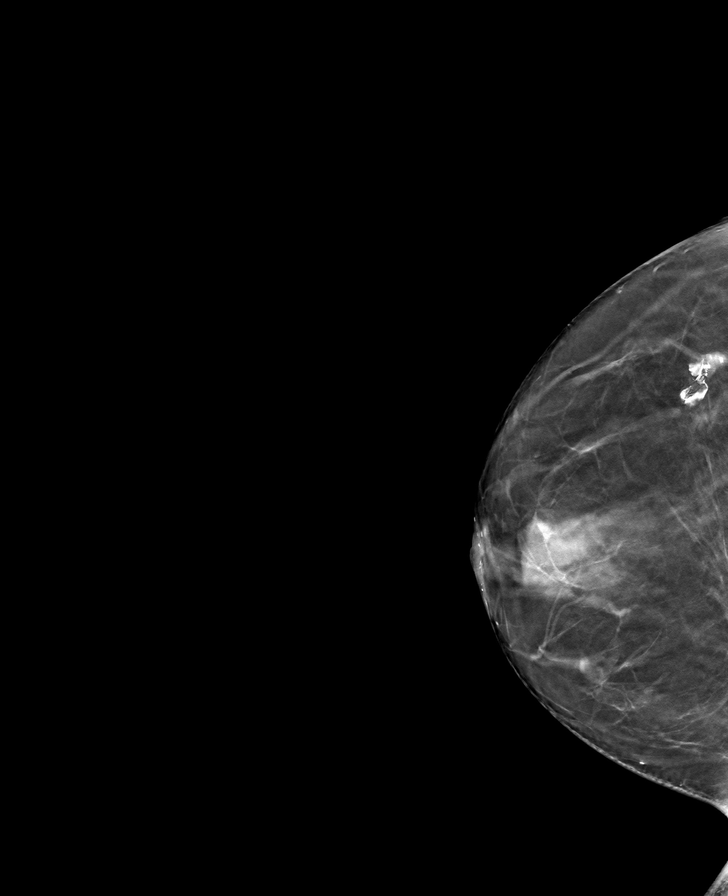

[L CC tomo · tomo slice 29/57.0]
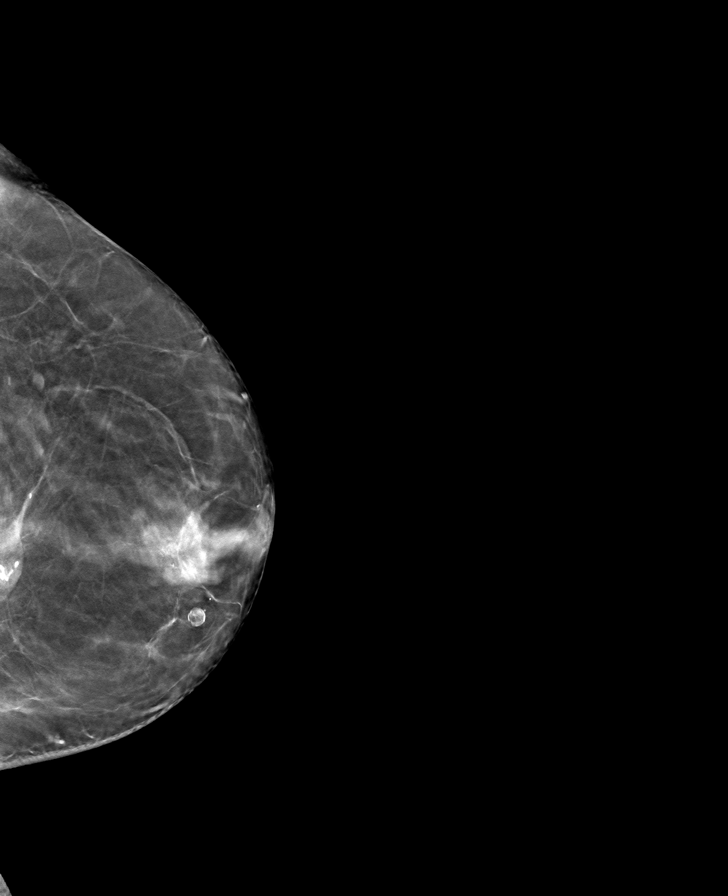

[8 of 24 positions shown; findings below may reference images not displayed]

ACR Breast Density Category b: There are scattered areas of
fibroglandular density.
FINDINGS: There are no findings suspicious for malignancy. Images were
processed with CAD.
IMPRESSION: No mammographic evidence of malignancy. A result letter of this
screening mammogram will be mailed directly to the patient.

RECOMMENDATION:
Screening mammogram in one year. (Code:[id number])

BI-RADS CATEGORY  1: Negative.

## 2022-06-07 DIAGNOSIS — R69 Illness, unspecified: Secondary | ICD-10-CM | POA: Diagnosis not present

## 2022-06-09 DIAGNOSIS — E7800 Pure hypercholesterolemia, unspecified: Secondary | ICD-10-CM | POA: Diagnosis not present

## 2022-06-09 DIAGNOSIS — R7303 Prediabetes: Secondary | ICD-10-CM | POA: Diagnosis not present

## 2022-06-09 DIAGNOSIS — E559 Vitamin D deficiency, unspecified: Secondary | ICD-10-CM | POA: Diagnosis not present

## 2022-06-09 DIAGNOSIS — M7712 Lateral epicondylitis, left elbow: Secondary | ICD-10-CM | POA: Diagnosis not present

## 2022-06-09 DIAGNOSIS — Z6824 Body mass index (BMI) 24.0-24.9, adult: Secondary | ICD-10-CM | POA: Diagnosis not present

## 2022-06-09 DIAGNOSIS — F909 Attention-deficit hyperactivity disorder, unspecified type: Secondary | ICD-10-CM | POA: Diagnosis not present

## 2022-06-10 DIAGNOSIS — H5203 Hypermetropia, bilateral: Secondary | ICD-10-CM | POA: Diagnosis not present

## 2022-07-06 DIAGNOSIS — M21962 Unspecified acquired deformity of left lower leg: Secondary | ICD-10-CM | POA: Diagnosis not present

## 2022-07-06 DIAGNOSIS — M778 Other enthesopathies, not elsewhere classified: Secondary | ICD-10-CM | POA: Diagnosis not present

## 2022-07-06 DIAGNOSIS — M21961 Unspecified acquired deformity of right lower leg: Secondary | ICD-10-CM | POA: Diagnosis not present

## 2022-07-06 DIAGNOSIS — M2011 Hallux valgus (acquired), right foot: Secondary | ICD-10-CM | POA: Diagnosis not present

## 2022-07-06 DIAGNOSIS — M2012 Hallux valgus (acquired), left foot: Secondary | ICD-10-CM | POA: Diagnosis not present

## 2022-07-08 DIAGNOSIS — Z5181 Encounter for therapeutic drug level monitoring: Secondary | ICD-10-CM | POA: Diagnosis not present

## 2022-07-16 DIAGNOSIS — Z0100 Encounter for examination of eyes and vision without abnormal findings: Secondary | ICD-10-CM | POA: Diagnosis not present

## 2022-08-03 DIAGNOSIS — M2012 Hallux valgus (acquired), left foot: Secondary | ICD-10-CM | POA: Diagnosis not present

## 2022-08-03 DIAGNOSIS — M21961 Unspecified acquired deformity of right lower leg: Secondary | ICD-10-CM | POA: Diagnosis not present

## 2022-08-03 DIAGNOSIS — M21962 Unspecified acquired deformity of left lower leg: Secondary | ICD-10-CM | POA: Diagnosis not present

## 2022-08-03 DIAGNOSIS — M778 Other enthesopathies, not elsewhere classified: Secondary | ICD-10-CM | POA: Diagnosis not present

## 2022-08-03 DIAGNOSIS — M2011 Hallux valgus (acquired), right foot: Secondary | ICD-10-CM | POA: Diagnosis not present

## 2022-09-29 DIAGNOSIS — Z1231 Encounter for screening mammogram for malignant neoplasm of breast: Secondary | ICD-10-CM | POA: Diagnosis not present

## 2022-09-29 DIAGNOSIS — Z0000 Encounter for general adult medical examination without abnormal findings: Secondary | ICD-10-CM | POA: Diagnosis not present

## 2022-09-29 DIAGNOSIS — Z6823 Body mass index (BMI) 23.0-23.9, adult: Secondary | ICD-10-CM | POA: Diagnosis not present

## 2022-09-30 ENCOUNTER — Other Ambulatory Visit: Payer: Self-pay | Admitting: Physician Assistant

## 2022-09-30 DIAGNOSIS — Z1231 Encounter for screening mammogram for malignant neoplasm of breast: Secondary | ICD-10-CM

## 2022-10-18 DIAGNOSIS — E7800 Pure hypercholesterolemia, unspecified: Secondary | ICD-10-CM | POA: Diagnosis not present

## 2022-10-18 DIAGNOSIS — R7303 Prediabetes: Secondary | ICD-10-CM | POA: Diagnosis not present

## 2022-10-18 DIAGNOSIS — F909 Attention-deficit hyperactivity disorder, unspecified type: Secondary | ICD-10-CM | POA: Diagnosis not present

## 2022-10-18 DIAGNOSIS — Z6824 Body mass index (BMI) 24.0-24.9, adult: Secondary | ICD-10-CM | POA: Diagnosis not present

## 2022-11-03 ENCOUNTER — Ambulatory Visit
Admission: RE | Admit: 2022-11-03 | Discharge: 2022-11-03 | Disposition: A | Discharge status: Home / Self Care | Payer: Medicare HMO | Source: Ambulatory Visit | Attending: Physician Assistant | Admitting: Physician Assistant

## 2022-11-03 DIAGNOSIS — Z1231 Encounter for screening mammogram for malignant neoplasm of breast: Secondary | ICD-10-CM

## 2023-04-18 DIAGNOSIS — E7800 Pure hypercholesterolemia, unspecified: Secondary | ICD-10-CM | POA: Diagnosis not present

## 2023-04-18 DIAGNOSIS — E559 Vitamin D deficiency, unspecified: Secondary | ICD-10-CM | POA: Diagnosis not present

## 2023-04-18 DIAGNOSIS — R7303 Prediabetes: Secondary | ICD-10-CM | POA: Diagnosis not present

## 2023-04-18 DIAGNOSIS — Z6823 Body mass index (BMI) 23.0-23.9, adult: Secondary | ICD-10-CM | POA: Diagnosis not present

## 2023-07-04 DIAGNOSIS — H524 Presbyopia: Secondary | ICD-10-CM | POA: Diagnosis not present

## 2023-07-04 DIAGNOSIS — H35722 Serous detachment of retinal pigment epithelium, left eye: Secondary | ICD-10-CM | POA: Diagnosis not present

## 2023-07-04 DIAGNOSIS — H52223 Regular astigmatism, bilateral: Secondary | ICD-10-CM | POA: Diagnosis not present

## 2023-07-04 DIAGNOSIS — H5203 Hypermetropia, bilateral: Secondary | ICD-10-CM | POA: Diagnosis not present

## 2023-07-04 DIAGNOSIS — H2513 Age-related nuclear cataract, bilateral: Secondary | ICD-10-CM | POA: Diagnosis not present

## 2023-08-12 DIAGNOSIS — M25512 Pain in left shoulder: Secondary | ICD-10-CM | POA: Diagnosis not present

## 2023-08-16 DIAGNOSIS — Z1211 Encounter for screening for malignant neoplasm of colon: Secondary | ICD-10-CM | POA: Diagnosis not present

## 2023-08-16 DIAGNOSIS — D123 Benign neoplasm of transverse colon: Secondary | ICD-10-CM | POA: Diagnosis not present

## 2023-08-16 DIAGNOSIS — K5730 Diverticulosis of large intestine without perforation or abscess without bleeding: Secondary | ICD-10-CM | POA: Diagnosis not present

## 2023-08-18 DIAGNOSIS — D123 Benign neoplasm of transverse colon: Secondary | ICD-10-CM | POA: Diagnosis not present

## 2023-10-28 DIAGNOSIS — Z0100 Encounter for examination of eyes and vision without abnormal findings: Secondary | ICD-10-CM | POA: Diagnosis not present

## 2023-11-08 DIAGNOSIS — Z1231 Encounter for screening mammogram for malignant neoplasm of breast: Secondary | ICD-10-CM | POA: Diagnosis not present
